# Patient Record
Sex: Female | Born: 1975 | Race: Black or African American | Hispanic: No | Marital: Single | Smoking: Never smoker
Health system: Southern US, Community
[De-identification: ages and names within clinical notes are randomized; demographics above are authoritative.]

## PROBLEM LIST (undated history)

## (undated) HISTORY — PX: BREAST REDUCTION SURGERY: SHX8

## (undated) HISTORY — PX: REDUCTION MAMMAPLASTY: SUR839

## (undated) HISTORY — PX: LAPAROSCOPIC GASTRIC BANDING: SHX1100

---

## 1998-11-27 ENCOUNTER — Encounter: Payer: Self-pay | Admitting: Emergency Medicine

## 1998-11-27 ENCOUNTER — Emergency Department (HOSPITAL_COMMUNITY): Admission: EM | Admit: 1998-11-27 | Discharge: 1998-11-27 | Payer: Self-pay | Admitting: Emergency Medicine

## 2002-02-05 ENCOUNTER — Encounter (INDEPENDENT_AMBULATORY_CARE_PROVIDER_SITE_OTHER): Payer: Self-pay | Admitting: Specialist

## 2002-02-05 ENCOUNTER — Ambulatory Visit (HOSPITAL_BASED_OUTPATIENT_CLINIC_OR_DEPARTMENT_OTHER): Admission: RE | Admit: 2002-02-05 | Discharge: 2002-02-05 | Payer: Self-pay | Admitting: Plastic Surgery

## 2003-05-23 ENCOUNTER — Emergency Department (HOSPITAL_COMMUNITY): Admission: EM | Admit: 2003-05-23 | Discharge: 2003-05-23 | Payer: Self-pay | Admitting: Emergency Medicine

## 2003-06-30 ENCOUNTER — Other Ambulatory Visit: Admission: RE | Admit: 2003-06-30 | Discharge: 2003-06-30 | Payer: Self-pay | Admitting: Obstetrics and Gynecology

## 2003-07-01 ENCOUNTER — Other Ambulatory Visit: Admission: RE | Admit: 2003-07-01 | Discharge: 2003-07-01 | Payer: Self-pay | Admitting: Obstetrics and Gynecology

## 2003-11-23 ENCOUNTER — Other Ambulatory Visit: Admission: RE | Admit: 2003-11-23 | Discharge: 2003-11-23 | Payer: Self-pay | Admitting: Obstetrics and Gynecology

## 2004-12-04 ENCOUNTER — Other Ambulatory Visit: Admission: RE | Admit: 2004-12-04 | Discharge: 2004-12-04 | Payer: Self-pay | Admitting: Obstetrics and Gynecology

## 2005-02-16 ENCOUNTER — Other Ambulatory Visit: Admission: RE | Admit: 2005-02-16 | Discharge: 2005-02-16 | Payer: Self-pay | Admitting: Obstetrics and Gynecology

## 2005-06-16 ENCOUNTER — Inpatient Hospital Stay (HOSPITAL_COMMUNITY): Admission: AD | Admit: 2005-06-16 | Discharge: 2005-06-16 | Payer: Self-pay | Admitting: Family Medicine

## 2006-05-27 ENCOUNTER — Encounter: Admission: RE | Admit: 2006-05-27 | Discharge: 2006-05-27 | Payer: Self-pay | Admitting: Obstetrics and Gynecology

## 2006-07-23 ENCOUNTER — Inpatient Hospital Stay (HOSPITAL_COMMUNITY): Admission: AD | Admit: 2006-07-23 | Discharge: 2006-07-26 | Payer: Self-pay | Admitting: Obstetrics and Gynecology

## 2007-08-13 ENCOUNTER — Encounter: Admission: RE | Admit: 2007-08-13 | Discharge: 2007-08-13 | Payer: Self-pay | Admitting: Family Medicine

## 2007-09-02 ENCOUNTER — Ambulatory Visit (HOSPITAL_COMMUNITY): Admission: RE | Admit: 2007-09-02 | Discharge: 2007-09-02 | Payer: Self-pay | Admitting: Surgery

## 2007-09-10 ENCOUNTER — Encounter: Admission: RE | Admit: 2007-09-10 | Discharge: 2007-11-24 | Payer: Self-pay | Admitting: Surgery

## 2007-09-12 ENCOUNTER — Ambulatory Visit (HOSPITAL_COMMUNITY): Admission: RE | Admit: 2007-09-12 | Discharge: 2007-09-12 | Payer: Self-pay | Admitting: Surgery

## 2007-12-09 ENCOUNTER — Ambulatory Visit (HOSPITAL_COMMUNITY): Admission: RE | Admit: 2007-12-09 | Discharge: 2007-12-10 | Payer: Self-pay | Admitting: Surgery

## 2007-12-09 HISTORY — PX: LAPAROSCOPIC GASTRIC BANDING: SHX1100

## 2007-12-31 ENCOUNTER — Encounter: Admission: RE | Admit: 2007-12-31 | Discharge: 2007-12-31 | Payer: Self-pay | Admitting: Surgery

## 2010-10-06 ENCOUNTER — Encounter
Admission: RE | Admit: 2010-10-06 | Discharge: 2010-10-06 | Payer: Self-pay | Source: Home / Self Care | Attending: Surgery | Admitting: Surgery

## 2011-03-06 NOTE — Op Note (Signed)
[name], [name]              ACCOUNT NO.:  [id number]   MEDICAL RECORD NO.:  [id number]          PATIENT TYPE:  OIB   LOCATION:  1527                         FACILITY:  [facility]   PHYSICIAN:  Thornton Park. Daphine Deutscher, MD  DATE OF BIRTH:  06/04/1976   DATE OF PROCEDURE:  12/09/2007  DATE OF DISCHARGE:                               OPERATIVE REPORT   PREOPERATIVE DIAGNOSIS:  Morbid obesity, BMI of 50 with arthritis and  history of gestational diabetes and sleep apnea.   SURGEON:  Thornton Park. Daphine Deutscher, MD   ASSISTANT:  Sandria Bales. Ezzard Standing, MD   ANESTHESIA:  General endotracheal.   PROCEDURE:  Placement of Allergan lap band APS system.   DESCRIPTION OF PROCEDURE:  Gina Curtis is a 35 year old African  American female taken to room 1 and given general anesthesia.  The  abdomen was prepped with Techni-Care and draped sterilely.  Access of  the abdomen was gained through the left upper quadrant using a zero  degree 10 mm Optiview approach without difficulty.  The abdomen was  insufflated and standard trocar placement was used with a 15 in the  right side upper port.  Eventually we dissected things nicely after  exposing the GE junction.  I first put down the APS testing balloon,  blew it up and pulled it back with 15 mL of air in it and it stopped at  the GE junction indicating there was unlikely any evidence of hiatal  hernia.  I had pulled up and gone over her studies and felt that there  was nothing that we could correct surgically.  I then passed the band  passer from the lower port on the right initially and it seemed like the  angle was a little bit too steep so I ended up putting the band in place  and bringing in from the upper port and it came in easily.  The band tip  was threaded into the band passer and brought around, snapped and  secured over the calibration tubing and then after it was completely  locked in place it was sutured in place with three sutures of Surgidac  using  free needles and tie knots.  The tubing was then brought out  through the lower port on the right, secured to the port and then  sutured to the fascia with four sutures of 2-0 Prolene.  The wounds were  all irrigated and then infiltrated with lidocaine and closed with 4-0  Vicryl with Dermabond.  The patient seemed to tolerate the procedure  well.  She was taken to the recovery room in satisfactory condition.      Thornton Park Daphine Deutscher, MD  Electronically Signed     MBM/MEDQ  D:  12/09/2007  T:  12/10/2007  Job:  [id number]   cc:   Jethro Bastos, M.D.  Fax: [phone]

## 2011-03-09 NOTE — Op Note (Signed)
[place]  Patient:    Gina Curtis, Gina Curtis Visit Number: [id number] MRN: [id number]          Service Type: DSU Location: [facility] Attending Physician:  Eloise Levels Dictated by:   Mary A. Contogiannis, M.D. Proc. Date: 02/05/02 Admit Date:  02/05/2002   CC:         Darcella Cheshire, M.D.   Operative Report  PREOPERATIVE DIAGNOSIS:  Bilateral macromastia.  POSTOPERATIVE DIAGNOSIS:  Bilateral macromastia.  PROCEDURE:  Bilateral reduction mammoplasty.  SURGEON:  Mary A. Contogiannis, M.D.  ASSISTANT:  Darcella Cheshire, M.D.  ANESTHESIA:  General endotracheal.  ANESTHESIOLOGIST:  Cliffton Asters. Ivin Booty, M.D.  ESTIMATED BLOOD LOSS:  250 cc.  FLUID REPLACEMENT:  2800 cc crystalloid.  URINE OUTPUT:  300 cc.  COMPLICATIONS:  None.  AMOUNT OF BREAST TISSUE REMOVED:  Right breast, 880 g; left breast, 908 g.  INDICATION FOR PROCEDURE:  The patient is a 35 year old African-American female who presents for bilateral reduction mammoplasties.  She has bilateral macromastia that is chronically symptomatic with neckaches, backaches, headaches, shoulder strap groove marks and pain.  JUSTIFICATION FOR OVERNIGHT STAY:  Progressive pain control along with ambulation and monitoring of the nipples and breast flaps.  DESCRIPTION OF PROCEDURE:  The patient was marked in the preop holding area in the pattern of Wise for the future reduction mammoplasties.  She was then taken back to the OR, placed on the table in supine position.  After adequate general endotracheal anesthesia was obtained, the patients chest was prepped with Betadine and alcohol and draped in a sterile fashion.  The skin and subcutaneous tissues at the base of both breasts were then injected with 1% lidocaine with epinephrine.  After adequate hemostasis and anesthesia had taken effect, the procedure was begun.  First the right breast reduction was performed.  The nipple was incised with  a 45 mm nipple marker.  The skin was then de-epithelialized around the nipple down to the inframammary crease in inferior pedicle pattern.  Next the medial, superior, and lateral skin flaps were elevated down to the chest wall.  The excess fat and glandular tissue were removed from the inferior pedicle.  The nipple was examined and found to be pink and viable.  The wound was irrigated with saline irrigation.  Meticulous hemostasis was obtained with the Bovie electrocautery.  The inferior pedicle was centralized using 3-0 Prolene suture.  A #10 JP flat, fully-fluted drain was then placed into the wound. Skin flaps brought together at the inverted T junction with a 2-0 Prolene suture.  The incisions were stapled for temporary closure.  Attention was then turned to the left breast.  The nipple was incised with the 45 mm nipple marker.  The skin was then de-epithelialized around the nipple down to the inframammary crease in inferior pedicle pattern.  Next the medial, superior, and lateral skin flaps were elevated down to the chest wall.  The excess fat and glandular tissue were removed from the inferior pedicle.  The nipple was then examined and found to be pink and viable.  The wound was irrigated with saline irrigation.  Meticulous hemostasis was obtained with the Bovie electrocautery.  The inferior pedicle was centralized using 3-0 Prolene suture.  A #10 JP flat, fully-fluted drain was placed into the wound.  The skin flaps were brought together at the inverted T junction with a 2-0 Prolene suture.  The incision was stapled for temporary closure.  The breasts were compared and found  to have good shape and symmetry.  The staples were then removed and the incisions closed with 3-0 Monocryl in the dermal layer, followed by 4-0 Monocryl running intracuticular stitch on the skin.  The patient was then placed in the upright position for future placement of the nipples.  The future nipple-areolar  complexes were marked with the 38 mm nipple marker.  She was then placed back into the supine position and recumbent.  First the future nipple-areolar complex on the right breast mound was incised.  This tissue was excised full-thickness.  The nipple was then examined and found to be pink and viable.  It was brought out into this aperture and sewn in place using 4-0 Monocryl interrupted dermal sutures followed by 5-0 Monocryl running intracuticular stitch on the skin.  Next, the future nipple-areolar complex on the left breast mound was incised.  This tissue was excised, and the nipple was examined and found to be pink and viable.  It was then brought out onto this aperture and sewn in place using 4-0 Monocryl interrupted dermal sutures, followed by 5-0 Monocryl running intracuticular stitch on the skin.  The JP drains were sewn in place using 3-0 nylon suture.  The incisions were then dressed with benzoin and Steri-Strips, followed by bacitracin ointment and Adaptic on the nipples.  The patient was then placed in a light postoperative support bra and 4 x 4s placed over the incisions.  There were no complications.  The patient tolerated the procedure well.  The final needle and sponge counts were reported to be correct at the end of the case.  The patient was then taken to the recovery room in stable condition.  She will remain overnight in the [facility] for progressive pain control along with ambulation and monitoring of the nipples and breast flaps. Discharge is planned for the morning. Dictated by:   Mary A. Contogiannis, M.D. Attending Physician:  Eloise Levels DD:  02/05/02 TD:  02/06/02 Job: [id number]

## 2011-06-29 ENCOUNTER — Encounter (INDEPENDENT_AMBULATORY_CARE_PROVIDER_SITE_OTHER): Payer: Self-pay

## 2011-06-29 ENCOUNTER — Ambulatory Visit (INDEPENDENT_AMBULATORY_CARE_PROVIDER_SITE_OTHER): Payer: Private Health Insurance - Indemnity | Admitting: Surgery

## 2011-06-29 ENCOUNTER — Encounter (INDEPENDENT_AMBULATORY_CARE_PROVIDER_SITE_OTHER): Payer: Private Health Insurance - Indemnity

## 2011-06-29 DIAGNOSIS — Z9884 Bariatric surgery status: Secondary | ICD-10-CM

## 2011-06-29 DIAGNOSIS — Z4651 Encounter for fitting and adjustment of gastric lap band: Secondary | ICD-10-CM

## 2011-06-29 NOTE — Progress Notes (Signed)
Has lost close to 100 lbs and is down under 200 lbs.  Felt that she needed a fill and I added 0.3 cc to her band.  Will see her back in 6 weeks. We talked about reducing the amount of salt intake in her food.

## 2011-07-13 LAB — DIFFERENTIAL
Basophils Absolute: 0
Basophils Relative: 0
Eosinophils Relative: 0
Lymphs Abs: 1.3
Monocytes Relative: 7
Neutro Abs: 10.9 — ABNORMAL HIGH

## 2011-07-13 LAB — BASIC METABOLIC PANEL
CO2: 28
Chloride: 103
GFR calc Af Amer: >60
GFR calc non Af Amer: >60

## 2011-07-13 LAB — CBC
RBC: 4.85
RDW: 15.4
WBC: 13.1 — ABNORMAL HIGH

## 2011-08-03 ENCOUNTER — Encounter (INDEPENDENT_AMBULATORY_CARE_PROVIDER_SITE_OTHER): Payer: Private Health Insurance - Indemnity | Admitting: Surgery

## 2012-10-23 ENCOUNTER — Encounter (INDEPENDENT_AMBULATORY_CARE_PROVIDER_SITE_OTHER): Payer: Private Health Insurance - Indemnity

## 2012-11-06 ENCOUNTER — Encounter (INDEPENDENT_AMBULATORY_CARE_PROVIDER_SITE_OTHER): Payer: Private Health Insurance - Indemnity

## 2013-02-12 ENCOUNTER — Ambulatory Visit (INDEPENDENT_AMBULATORY_CARE_PROVIDER_SITE_OTHER): Payer: Private Health Insurance - Indemnity | Admitting: Physician Assistant

## 2013-02-12 ENCOUNTER — Encounter (INDEPENDENT_AMBULATORY_CARE_PROVIDER_SITE_OTHER): Payer: Self-pay

## 2013-02-12 VITALS — BP 104/72 | HR 85 | Temp 98.0°F | Resp 16 | Ht 63.0 in | Wt 181.4 lb

## 2013-02-12 DIAGNOSIS — Z4651 Encounter for fitting and adjustment of gastric lap band: Secondary | ICD-10-CM

## 2013-02-12 NOTE — Patient Instructions (Signed)
Take clear liquids tonight. Thin protein shakes are ok to start tomorrow morning. Slowly advance your diet thereafter. Call us if you have persistent vomiting or regurgitation, night cough or reflux symptoms. Return as scheduled or sooner if you notice no changes in hunger/portion sizes.  

## 2013-02-12 NOTE — Progress Notes (Signed)
  HISTORY: Gina Curtis is a 37 y.o.female who received an AP-Standard lap-band in February 2009 by Dr. Daphine Deutscher. She comes in with 16 lbs weight loss since her last visit in September 2012. She is working hard at keeping this weight off. She has times when she can eat more than she feels is enough. She has no complaints of reflux or regurgitation. She thinks a fill will help get her weight down, ideally less than BMI 30.  VITAL SIGNS: Filed Vitals:   02/12/13 1608  BP: 104/72  Pulse: 85  Temp: 98 F (36.7 C)  Resp: 16    PHYSICAL EXAM: Physical exam reveals a very well-appearing 37 y.o.female in no apparent distress Neurologic: Awake, alert, oriented Psych: Bright affect, conversant Respiratory: Breathing even and unlabored. No stridor or wheezing Abdomen: Soft, nontender, nondistended to palpation. Incisions well-healed. No incisional hernias. Port easily palpated. Extremities: Atraumatic, good range of motion.  ASSESMENT: 37 y.o.  female  s/p AP-Standard lap-band.   PLAN: The patient's port was accessed with a 20G Huber needle without difficulty. Clear fluid was aspirated and 0.25 mL saline was added to the port. The patient was able to swallow water without difficulty following the procedure and was instructed to take clear liquids for the next 24-48 hours and advance slowly as tolerated.

## 2016-04-09 ENCOUNTER — Telehealth (HOSPITAL_COMMUNITY): Payer: Self-pay | Admitting: *Deleted

## 2016-04-09 NOTE — Telephone Encounter (Signed)
Telephoned patient at home # and left message to return call to [facility] 

## 2016-04-10 ENCOUNTER — Other Ambulatory Visit: Payer: Self-pay | Admitting: Obstetrics and Gynecology

## 2016-04-10 DIAGNOSIS — Z1231 Encounter for screening mammogram for malignant neoplasm of breast: Secondary | ICD-10-CM

## 2016-04-20 ENCOUNTER — Ambulatory Visit (HOSPITAL_COMMUNITY): Payer: Self-pay

## 2016-04-25 ENCOUNTER — Telehealth (HOSPITAL_COMMUNITY): Payer: Self-pay | Admitting: *Deleted

## 2016-04-25 NOTE — Telephone Encounter (Signed)
Telephoned patient at home # and left message to return call to [facility] 

## 2016-06-06 ENCOUNTER — Telehealth (HOSPITAL_COMMUNITY): Payer: Self-pay | Admitting: *Deleted

## 2016-06-06 NOTE — Telephone Encounter (Signed)
Telephoned patient at home number and left message concerning [facility] appointment for Thursday August 17 11:30.

## 2016-06-07 ENCOUNTER — Ambulatory Visit (HOSPITAL_COMMUNITY)
Admission: RE | Admit: 2016-06-07 | Discharge: 2016-06-07 | Disposition: A | Discharge status: Home / Self Care | Payer: Self-pay | Source: Ambulatory Visit | Attending: Obstetrics and Gynecology | Admitting: Obstetrics and Gynecology

## 2016-06-07 ENCOUNTER — Encounter (HOSPITAL_COMMUNITY): Payer: Self-pay | Admitting: *Deleted

## 2016-06-07 ENCOUNTER — Ambulatory Visit
Admission: RE | Admit: 2016-06-07 | Discharge: 2016-06-07 | Disposition: A | Discharge status: Home / Self Care | Payer: Self-pay | Source: Ambulatory Visit | Attending: Obstetrics and Gynecology | Admitting: Obstetrics and Gynecology

## 2016-06-07 VITALS — BP 112/74 | Temp 98.4°F | Ht 64.0 in | Wt 185.2 lb

## 2016-06-07 DIAGNOSIS — Z1231 Encounter for screening mammogram for malignant neoplasm of breast: Secondary | ICD-10-CM

## 2016-06-07 DIAGNOSIS — Z1239 Encounter for other screening for malignant neoplasm of breast: Secondary | ICD-10-CM

## 2016-06-07 NOTE — Progress Notes (Signed)
Complaints of mild bilateral breast pain before and during menstrual cycle.  Pap Smear:  Pap smear not completed today. Last Pap smear was in May 2017 at [facility] and normal per patient. Per patient has a history of an abnormal Pap smear over 5 years ago that cryotherapy was completed for follow up. Patient stated she has had at least three normal Pap smears since the cryotherapy. No Pap smear results are in EPIC.  Physical exam: Breasts Breasts symmetrical. Patient has scars under bilateral breasts due to history of breast reduction surgery. No nipple retraction bilateral breasts. No nipple discharge bilateral breasts. No lymphadenopathy. No lumps palpated bilateral breasts. No complaints of pain or tenderness on exam. [facility] Appointment scheduled for Thursday, June 07, 2016 at 1205.        Pelvic/Bimanual No Pap smear completed today since last Pap smear was in May 2017 per patient. Pap smear not indicated per [facility] guidelines.   Smoking History: Patient has never smoked.  Patient Navigation: Patient education provided. [facility].

## 2016-06-07 NOTE — Patient Instructions (Signed)
Explained breast self awareness to [employer]	[place].Patient did not need a Pap smear today due to last Pap smear was in May 2017 per patient. Let her know [facility] will cover Pap smears every 3 years unless has a history of abnormal Pap smears. [facility] Appointment scheduled for Thursday, June 07, 2016 at 1205.   Let patient know the Breast Center will follow up with her within the next couple weeks with results of mammogram by letter or phone. Gina SecondRenee Curtis verbalized understanding.  Curtis, Gina Maserhristine Poll, RN 1:10 PM

## 2016-06-11 ENCOUNTER — Encounter (HOSPITAL_COMMUNITY): Payer: Self-pay | Admitting: *Deleted

## 2016-06-12 ENCOUNTER — Other Ambulatory Visit: Payer: Self-pay | Admitting: Obstetrics and Gynecology

## 2016-06-12 DIAGNOSIS — R928 Other abnormal and inconclusive findings on diagnostic imaging of breast: Secondary | ICD-10-CM

## 2016-06-29 ENCOUNTER — Other Ambulatory Visit: Payer: No Typology Code available for payment source

## 2016-07-27 ENCOUNTER — Telehealth (HOSPITAL_COMMUNITY): Payer: Self-pay | Admitting: *Deleted

## 2016-07-27 NOTE — Telephone Encounter (Signed)
Patient [facility] 06[facility]. Additional imaging of the right breast is recommended for follow up. Patient was notified of results by the Breast Center and had a follow up appointment scheduled on 06/12/2016. Patient did not show for appointment. A letter was sent on 07/02/2016 from the Breast Center to remind patient to schedule follow up appointment. Patient was called by the Breast Center on 07/10/2016 and a voicemail was left for patient to call back. The Breast Center will send a certified letter to remind patient to schedule follow up appointment.

## 2016-08-08 ENCOUNTER — Telehealth (HOSPITAL_COMMUNITY): Payer: Self-pay | Admitting: *Deleted

## 2016-08-08 NOTE — Telephone Encounter (Signed)
Telephoned patient at home number and left message to return call to [facility] 

## 2016-08-22 ENCOUNTER — Telehealth (HOSPITAL_COMMUNITY): Payer: Self-pay | Admitting: *Deleted

## 2016-08-22 NOTE — Telephone Encounter (Signed)
Telephoned patient at home number and advised patient to call Breast Center for additional imaging. Patient states will call 11/2 for appointment.

## 2016-09-12 ENCOUNTER — Encounter (HOSPITAL_COMMUNITY): Payer: Self-pay | Admitting: *Deleted

## 2018-01-24 ENCOUNTER — Other Ambulatory Visit: Payer: Self-pay | Admitting: Family Medicine

## 2018-01-24 ENCOUNTER — Other Ambulatory Visit: Payer: Self-pay | Admitting: Obstetrics and Gynecology

## 2018-01-24 DIAGNOSIS — N6489 Other specified disorders of breast: Secondary | ICD-10-CM

## 2018-01-27 ENCOUNTER — Other Ambulatory Visit: Payer: Self-pay | Admitting: Physician Assistant

## 2018-01-27 DIAGNOSIS — N6489 Other specified disorders of breast: Secondary | ICD-10-CM

## 2018-01-31 ENCOUNTER — Ambulatory Visit: Payer: No Typology Code available for payment source

## 2018-01-31 ENCOUNTER — Ambulatory Visit
Admission: RE | Admit: 2018-01-31 | Discharge: 2018-01-31 | Disposition: A | Discharge status: Home / Self Care | Payer: BLUE CROSS/BLUE SHIELD | Source: Ambulatory Visit | Attending: Obstetrics and Gynecology | Admitting: Obstetrics and Gynecology

## 2018-01-31 DIAGNOSIS — N6489 Other specified disorders of breast: Secondary | ICD-10-CM

## 2018-02-21 ENCOUNTER — Encounter: Payer: Self-pay | Admitting: Obstetrics and Gynecology

## 2019-12-01 IMAGING — MG DIGITAL DIAGNOSTIC BILATERAL MAMMOGRAM WITH TOMO AND CAD
7 series · 8 of 19 positions shown · non-contrast
Comparison: Previous exam(s).

CLINICAL DATA: Patient was called back on screening exam [DATE] for
evaluation until today's exam.

EXAM:
DIGITAL DIAGNOSTIC BILATERAL MAMMOGRAM WITH CAD AND TOMO

[L MLO synth-2D]
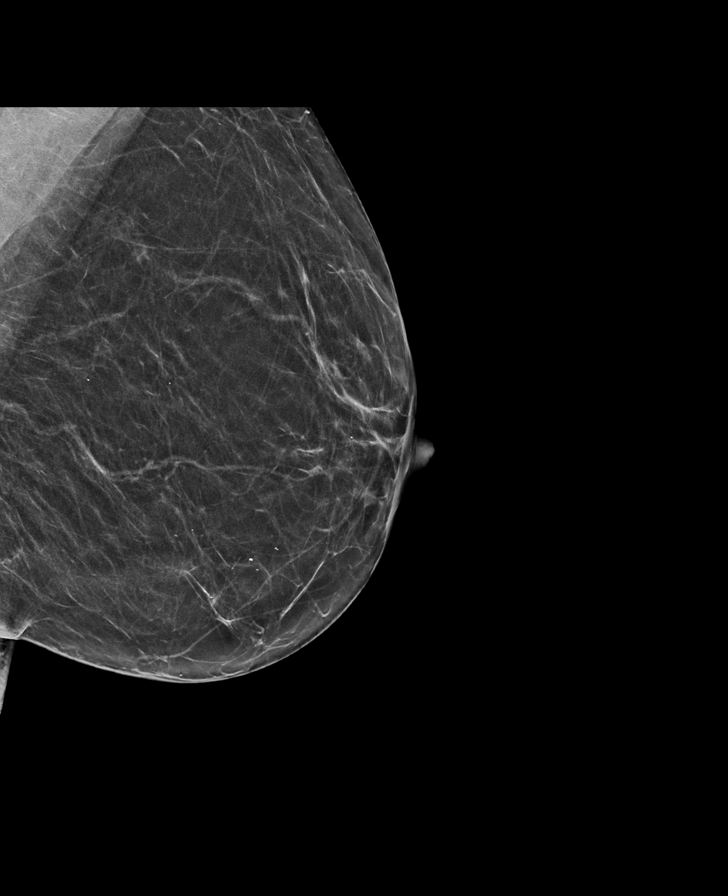

[L CC synth-2D]
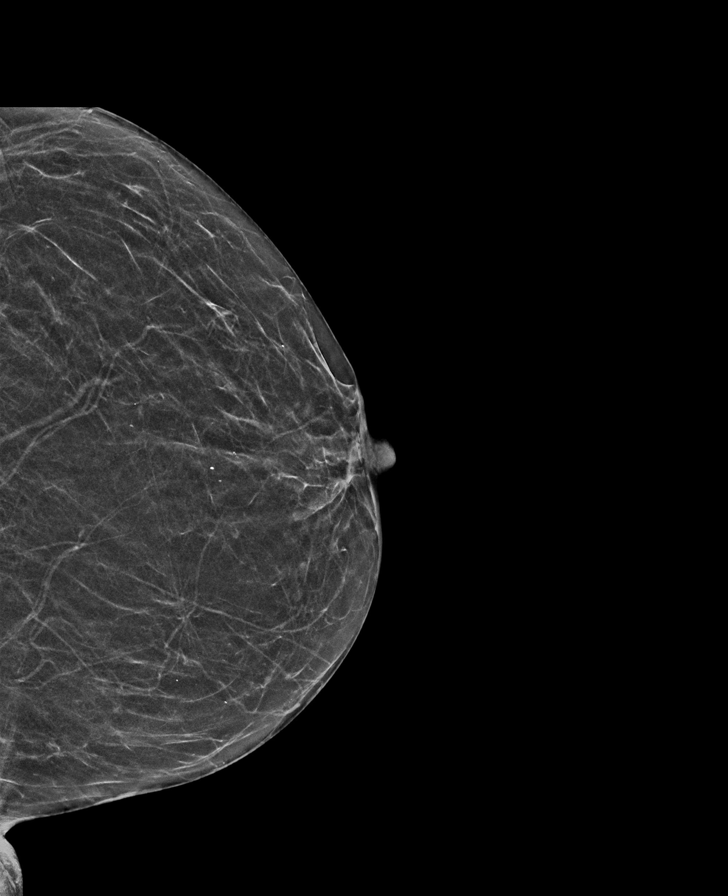

[R CC synth-2D]
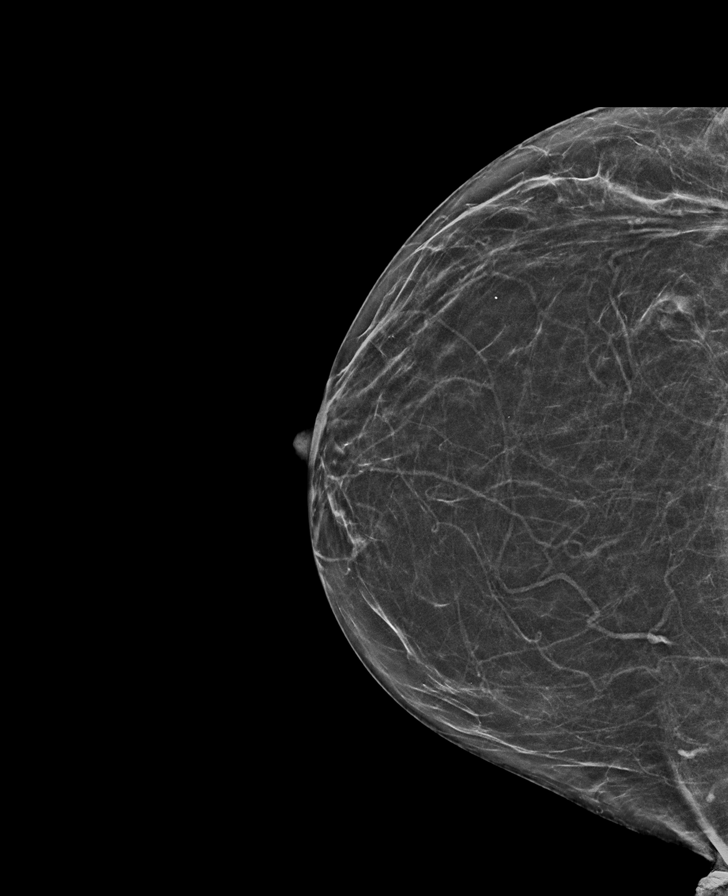

[R MLO synth-2D]
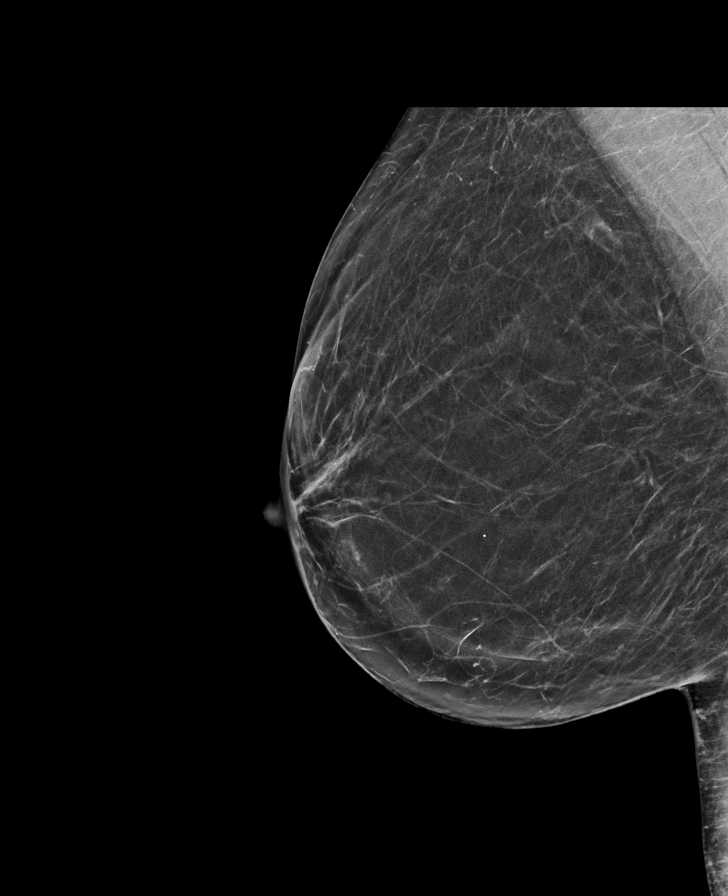

[R MLO tomo · 2 of 59 frames shown]
[frame 20/59]
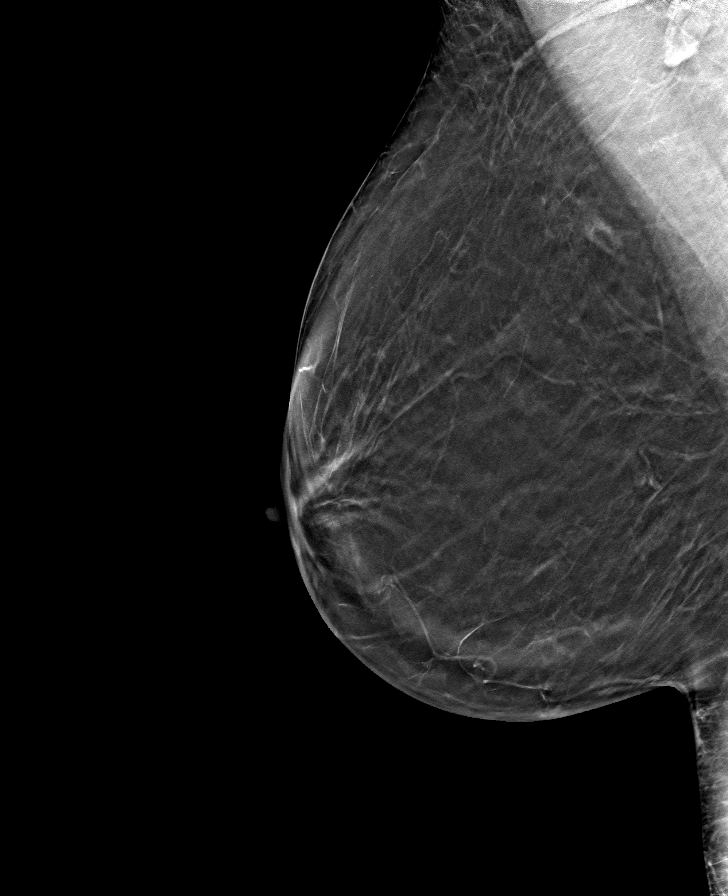
[frame 30/59]
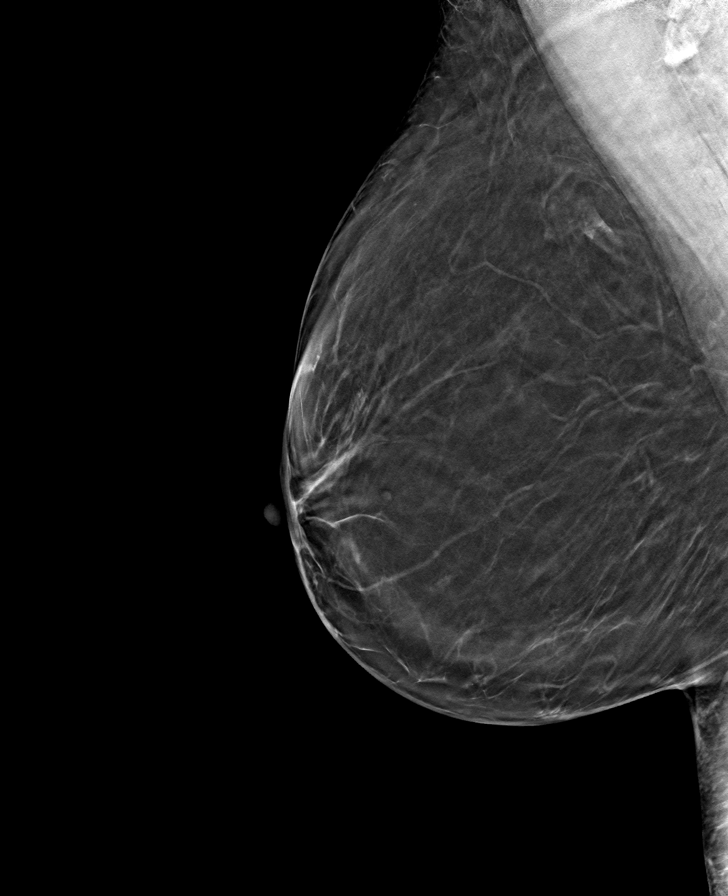

[R CC tomo · tomo slice 28/55.0]
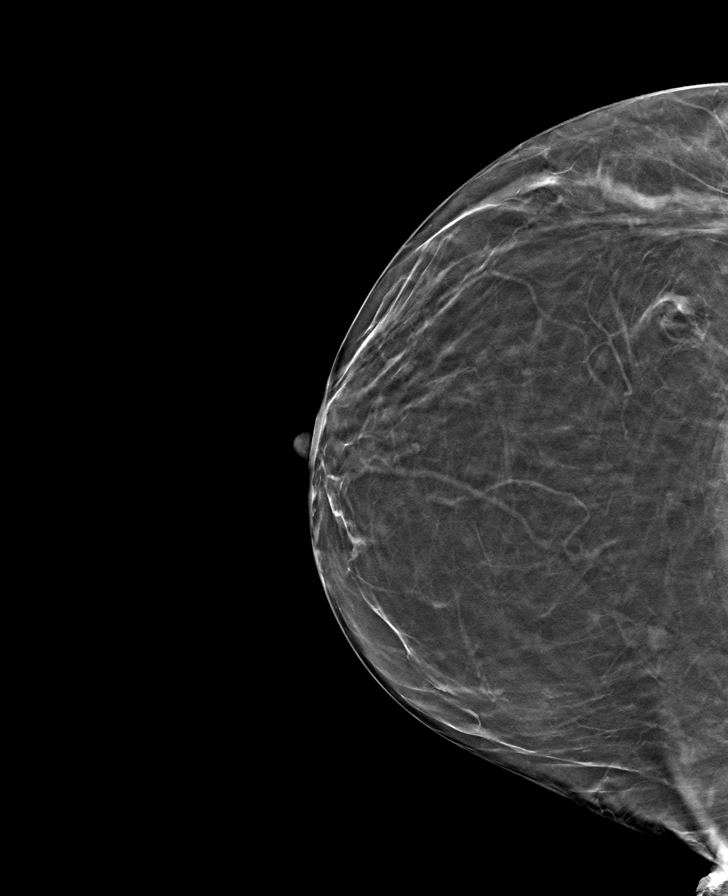

[L MLO tomo · tomo slice 30/59.0]
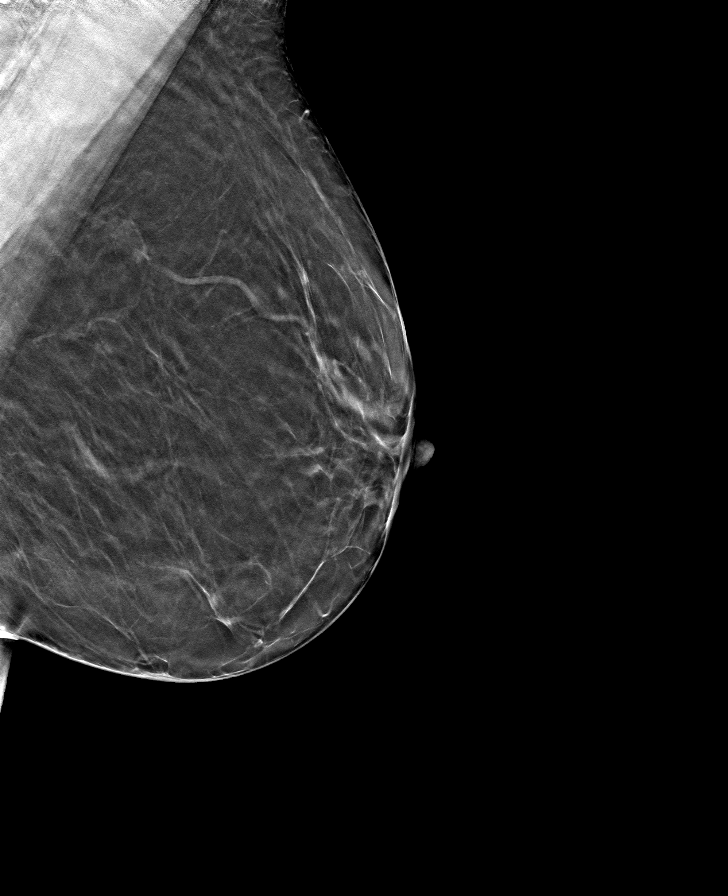

[8 of 19 positions shown; findings below may reference images not displayed]

ACR Breast Density Category b: There are scattered areas of
fibroglandular density.
FINDINGS: Persistent asymmetry within the upper-outer right breast, further
evaluated with tomosynthesis images. Findings represent postsurgical
change status post bilateral reduction mammoplasty. No suspicious
masses, calcifications or nonsurgical distortion identified within
either breast.

Mammographic images were processed with CAD.
IMPRESSION: Postreduction changes within both breasts.

No mammographic evidence for malignancy.

RECOMMENDATION:
Screening mammogram in one year.(Code:CI-[id number])

I have discussed the findings and recommendations with the patient.
Results were also provided in writing at the conclusion of the
visit. If applicable, a reminder letter will be sent to the patient
regarding the next appointment.

BI-RADS CATEGORY  2: Benign.

## 2020-01-23 ENCOUNTER — Ambulatory Visit: Payer: Medicaid Other | Attending: Family

## 2020-01-23 DIAGNOSIS — Z23 Encounter for immunization: Secondary | ICD-10-CM

## 2020-01-23 NOTE — Progress Notes (Signed)
   Covid-19 Vaccination Clinic  Name:  Gina Curtis    MRN: [id number] DOB: 03/06/1976  01/23/2020  Ms. Gina Curtis was observed post Covid-19 immunization for 15 minutes without incident. She was provided with Vaccine Information Sheet and instruction to access the V-Safe system.   Ms. Gina Curtis was instructed to call 911 with any severe reactions post vaccine: Marland Kitchen Difficulty breathing  . Swelling of face and throat  . A fast heartbeat  . A bad rash all over body  . Dizziness and weakness   Immunizations Administered    Name Date Dose VIS Date Route   JANSSEN COVID-19 VACCINE 01/23/2020  2:16 PM 0.5 mL 12/19/2019 Intramuscular   Manufacturer: [employer]   Lot: [id number]   NDC: 41287-867-67

## 2022-11-15 ENCOUNTER — Other Ambulatory Visit: Payer: Self-pay

## 2022-11-15 DIAGNOSIS — R7989 Other specified abnormal findings of blood chemistry: Secondary | ICD-10-CM

## 2022-11-15 DIAGNOSIS — E041 Nontoxic single thyroid nodule: Secondary | ICD-10-CM

## 2022-12-04 ENCOUNTER — Inpatient Hospital Stay: Admission: RE | Admit: 2022-12-04 | Payer: Medicaid Other | Source: Ambulatory Visit

## 2023-04-08 NOTE — Progress Notes (Deleted)
[facility] [phone]   [phone]  CONSULT NOTE  REFERRING PHYSICIAN:  Linus Galas NP  REASON FOR CONSULTATION:  IDA  HPI Gina Curtis is a 47 y.o. female with history for lap band surgery by Dr. Marland KitchenIn**,  ***is referred to clinic for iron deficiency anemia.  She saw her PCP in February 2024 in which the patient had microcytic anemia at that time and her iron studies showed iron deficiency.  There is recommended that she start on iron supplements.  If she had intolerance to iron supplements, she was advised to call back for consideration of prescription iron.  Patient then followed up for a 38-month follow-up visit on 03/20/2023.  She had repeat lab work a few days prior which continue to show similar microcytic anemia with a hemoglobin of 8.5, low MCV at 65, elevated platelet count at 503, and elevated RDW at 17.5.  Her iron studies showed significant iron deficiency with Krazati 4, elevated" 466, low iron at 11.  The patient reportedly had significant intolerance to iron supplement with constipation.  Therefore, she was referred to the clinic for consideration of IV iron.  To the patient's knowledge, she has had anemia for approximately ***years.  She has never required an iron infusion or blood transfusion before.  Labs iron deficiency at that time.  She her oldest labs available to me are from 2009.  She did not have any anemia at that time.  I then did not have any repeat labs until 2019 in which she had some microcytic anemia at that time with a hemoglobin of 10.4.  I do not have any other lab studies besides February 2024 which showed continued microcytic anemia.    She is not sure when she started to become anemic but it may have been since she was a teenager.  She denies ever needing a blood transfusion or an iron infusion before.    Sshe has a menstrual cycle every *** weeks lasting approximately *** days days.  ***heavy**8? Clots*** She  denies any spotting in between her cycles.     Overall regarding symptoms, she has some increased fatigue. She occasionally has lightheadedness and mild dyspnea on exertion.  She may ***have palpitations.  She denies gingival bleeding, epistaxis, hemoptysis, hematemesis, hematochezia, or melena.  Denies any changes in bowel habits.  Denies any fever, chills, or night sweats.  She denies any lymphadenopathy.  She may take *** ibuprofen *** for headaches.  She denies any particular dietary habits such as being a vegan or vegetarian, but she ****.  She still estimates she eats red meat approximately *** times per week.  She does not crave any ice chips.     She is not taking any blood thinners.    She denies any family history of sickle cell anemia or trait.     HPI  No past medical history on file.  Past Surgical History:  Procedure Laterality Date   BREAST REDUCTION SURGERY     LAPAROSCOPIC GASTRIC BANDING  12/09/07   LAPAROSCOPIC GASTRIC BANDING     REDUCTION MAMMAPLASTY Bilateral     Family History  Adopted: Yes    Social History Social History   Tobacco Use   Smoking status: Never   Smokeless tobacco: Never  Substance Use Topics   Alcohol use: Yes    Comment: social   Drug use: No    No Known Allergies  No current outpatient medications on file.   No current facility-administered  medications for this visit.    REVIEW OF SYSTEMS:   Review of Systems  Constitutional: Negative for appetite change, chills, fatigue, fever and unexpected weight change.  HENT:   Negative for mouth sores, nosebleeds, sore throat and trouble swallowing.   Eyes: Negative for eye problems and icterus.  Respiratory: Negative for cough, hemoptysis, shortness of breath and wheezing.   Cardiovascular: Negative for chest pain and leg swelling.  Gastrointestinal: Negative for abdominal pain, constipation, diarrhea, nausea and vomiting.  Genitourinary: Negative for bladder incontinence, difficulty  urinating, dysuria, frequency and hematuria.   Musculoskeletal: Negative for back pain, gait problem, neck pain and neck stiffness.  Skin: Negative for itching and rash.  Neurological: Negative for dizziness, extremity weakness, gait problem, headaches, light-headedness and seizures.  Hematological: Negative for adenopathy. Does not bruise/bleed easily.  Psychiatric/Behavioral: Negative for confusion, depression and sleep disturbance. The patient is not nervous/anxious.     PHYSICAL EXAMINATION:  There were no vitals taken for this visit.  ECOG PERFORMANCE STATUS: {CHL ONC ECOG [id number]  Physical Exam  Constitutional: Oriented to person, place, and time and well-developed, well-nourished, and in no distress. No distress.  HENT:  Head: Normocephalic and atraumatic.  Mouth/Throat: Oropharynx is clear and moist. No oropharyngeal exudate.  Eyes: Conjunctivae are normal. Right eye exhibits no discharge. Left eye exhibits no discharge. No scleral icterus.  Neck: Normal range of motion. Neck supple.  Cardiovascular: Normal rate, regular rhythm, normal heart sounds and intact distal pulses.   Pulmonary/Chest: Effort normal and breath sounds normal. No respiratory distress. No wheezes. No rales.  Abdominal: Soft. Bowel sounds are normal. Exhibits no distension and no mass. There is no tenderness.  Musculoskeletal: Normal range of motion. Exhibits no edema.  Lymphadenopathy:    No cervical adenopathy.  Neurological: Alert and oriented to person, place, and time. Exhibits normal muscle tone. Gait normal. Coordination normal.  Skin: Skin is warm and dry. No rash noted. Not diaphoretic. No erythema. No pallor.  Psychiatric: Mood, memory and judgment normal.  Vitals reviewed.  LABORATORY DATA: Lab Results  Component Value Date   WBC 13.1 (H) 12/10/2007   HGB 13.0 12/10/2007   HCT 38.4 12/10/2007   MCV 79.3 12/10/2007   PLT 362 12/10/2007      Chemistry      Component Value  Date/Time   NA 138 12/03/2007 1410   K 3.9 12/03/2007 1410   CL 103 12/03/2007 1410   CO2 28 12/03/2007 1410   BUN 9 12/03/2007 1410   CREATININE 0.85 12/03/2007 1410      Component Value Date/Time   CALCIUM 9.5 12/03/2007 1410       RADIOGRAPHIC STUDIES: No results found.  ASSESSMENT: This is a very pleasant 47 year old ***female referred to the clinic for iron deficiency anemia. She has a history of lap band in ***    The patient had several lab studies performed today including CBC, CMP, iron studies, ferritin, B12, folate, stool cards, SPEP, and hemoglobin electrophoresis.   The patient's labs from today demonstrate significant microcytic anemia with a hemoglobin of ***, very low MCV at *** and elevated RDW at ***.  Her CMP is unremarkable.  Her other lab studies are pending at this time.   We anticipate the patient's iron will be low.  She likely will benefit from iron infusions with Venofer 400 mg weekly x 3. We will see if this can be given on ***    ***Prescription iron***. Has significant intolerance to OTC iron with severe constipation. She also  was instructed to take an over-the-counter iron supplement 1 tablet p.o. daily with vitamin C.  She is also given a handout on iron rich food on her AVS and she was strongly encouraged to increase her dietary intake of iron rich food.   Will see her back for follow-up visit in 2 months for evaluation repeat blood work.  ***I will arrange for stool cards to rule out GI blood loss. If positive, then would recommend referral to GI.   The patient voices understanding of current disease status and treatment options and is in agreement with the current care plan.  All questions were answered. The patient knows to call the clinic with any problems, questions or concerns. We can certainly see the patient much sooner if necessary.  Thank you so much for allowing me to participate in the care of Gina Curtis. I will continue to follow up  the patient with you and assist in her care.  I spent {CHL ONC TIME VISIT - WGNFA:[id number]} counseling the patient face to face. The total time spent in the appointment was {CHL ONC TIME VISIT - ONGEX:[id number]}.  Disclaimer: This note was dictated with voice recognition software. Similar sounding words can inadvertently be transcribed and may not be corrected upon review.   Tedi Hughson L Rose Hegner April 08, 2023, 8:20 PM

## 2023-04-09 ENCOUNTER — Other Ambulatory Visit: Payer: Self-pay | Admitting: Medical Oncology

## 2023-04-09 DIAGNOSIS — D649 Anemia, unspecified: Secondary | ICD-10-CM

## 2023-04-10 ENCOUNTER — Other Ambulatory Visit: Payer: Self-pay | Admitting: Physician Assistant

## 2023-04-10 ENCOUNTER — Inpatient Hospital Stay: Payer: Self-pay

## 2023-04-10 ENCOUNTER — Inpatient Hospital Stay: Payer: Self-pay | Attending: Physician Assistant | Admitting: Physician Assistant

## 2023-04-10 DIAGNOSIS — D649 Anemia, unspecified: Secondary | ICD-10-CM

## 2023-05-16 NOTE — Progress Notes (Deleted)
[facility] [phone]   Fax:(336) [phone]  CONSULT NOTE  REFERRING PHYSICIAN: Linus Galas NP  REASON FOR CONSULTATION:  IDA  HPI Gina Curtis is a 47 y.o. female.  with history for lap band surgery by Dr. Marland KitchenIn**,  ***is referred to clinic for iron deficiency anemia.  She saw her PCP in February 2024 in which the patient had microcytic anemia at that time and her iron studies showed iron deficiency.  There is recommended that she start on iron supplements.  If she had intolerance to iron supplements, she was advised to call back for consideration of prescription iron.  Patient then followed up for a 31-month follow-up visit on 03/20/2023.  She had repeat lab work a few days prior which continue to show similar microcytic anemia with a hemoglobin of 8.5, low MCV at 65, elevated platelet count at 503, and elevated RDW at 17.5.  Her iron studies showed significant iron deficiency with Krazati 4, elevated" 466, low iron at 11.  The patient reportedly had significant intolerance to iron supplement with constipation.  Therefore, she was referred to the clinic for consideration of IV iron.  To the patient's knowledge, she has had anemia for approximately ***years.  She has never required an iron infusion or blood transfusion before.  Labs iron deficiency at that time.  She her oldest labs available to me are from 2009.  She did not have any anemia at that time.  I then did not have any repeat labs until 2019 in which she had some microcytic anemia at that time with a hemoglobin of 10.4.  I do not have any other lab studies besides February 2024 which showed continued microcytic anemia.    She is not sure when she started to become anemic but it may have been since she was a teenager.  She denies ever needing a blood transfusion or an iron infusion before.    Sshe has a menstrual cycle every *** weeks lasting approximately *** days days.  ***heavy**8? Clots*** She  denies any spotting in between her cycles.     Overall regarding symptoms, she has some increased fatigue. She occasionally has lightheadedness and mild dyspnea on exertion.  She may ***have palpitations.  She denies gingival bleeding, epistaxis, hemoptysis, hematemesis, hematochezia, or melena.  Denies any changes in bowel habits.  Denies any fever, chills, or night sweats.  She denies any lymphadenopathy.  She may take *** ibuprofen *** for headaches.  She denies any particular dietary habits such as being a vegan or vegetarian, but she ****.  She still estimates she eats red meat approximately *** times per week.  She does not crave any ice chips.     She is not taking any blood thinners.    She denies any family history of sickle cell anemia or trait.   HPI  No past medical history on file.  Past Surgical History:  Procedure Laterality Date   BREAST REDUCTION SURGERY     LAPAROSCOPIC GASTRIC BANDING  12/09/07   LAPAROSCOPIC GASTRIC BANDING     REDUCTION MAMMAPLASTY Bilateral     Family History  Adopted: Yes    Social History Social History   Tobacco Use   Smoking status: Never   Smokeless tobacco: Never  Substance Use Topics   Alcohol use: Yes    Comment: social   Drug use: No    No Known Allergies  No current outpatient medications on file.   No current facility-administered medications for  this visit.    REVIEW OF SYSTEMS:   Review of Systems  Constitutional: Negative for appetite change, chills, fatigue, fever and unexpected weight change.  HENT:   Negative for mouth sores, nosebleeds, sore throat and trouble swallowing.   Eyes: Negative for eye problems and icterus.  Respiratory: Negative for cough, hemoptysis, shortness of breath and wheezing.   Cardiovascular: Negative for chest pain and leg swelling.  Gastrointestinal: Negative for abdominal pain, constipation, diarrhea, nausea and vomiting.  Genitourinary: Negative for bladder incontinence, difficulty  urinating, dysuria, frequency and hematuria.   Musculoskeletal: Negative for back pain, gait problem, neck pain and neck stiffness.  Skin: Negative for itching and rash.  Neurological: Negative for dizziness, extremity weakness, gait problem, headaches, light-headedness and seizures.  Hematological: Negative for adenopathy. Does not bruise/bleed easily.  Psychiatric/Behavioral: Negative for confusion, depression and sleep disturbance. The patient is not nervous/anxious.     PHYSICAL EXAMINATION:  There were no vitals taken for this visit.  ECOG PERFORMANCE STATUS: {CHL ONC ECOG [id number]  Physical Exam  Constitutional: Oriented to person, place, and time and well-developed, well-nourished, and in no distress. No distress.  HENT:  Head: Normocephalic and atraumatic.  Mouth/Throat: Oropharynx is clear and moist. No oropharyngeal exudate.  Eyes: Conjunctivae are normal. Right eye exhibits no discharge. Left eye exhibits no discharge. No scleral icterus.  Neck: Normal range of motion. Neck supple.  Cardiovascular: Normal rate, regular rhythm, normal heart sounds and intact distal pulses.   Pulmonary/Chest: Effort normal and breath sounds normal. No respiratory distress. No wheezes. No rales.  Abdominal: Soft. Bowel sounds are normal. Exhibits no distension and no mass. There is no tenderness.  Musculoskeletal: Normal range of motion. Exhibits no edema.  Lymphadenopathy:    No cervical adenopathy.  Neurological: Alert and oriented to person, place, and time. Exhibits normal muscle tone. Gait normal. Coordination normal.  Skin: Skin is warm and dry. No rash noted. Not diaphoretic. No erythema. No pallor.  Psychiatric: Mood, memory and judgment normal.  Vitals reviewed.  LABORATORY DATA: Lab Results  Component Value Date   WBC 13.1 (H) 12/10/2007   HGB 13.0 12/10/2007   HCT 38.4 12/10/2007   MCV 79.3 12/10/2007   PLT 362 12/10/2007      Chemistry      Component Value  Date/Time   NA 138 12/03/2007 1410   K 3.9 12/03/2007 1410   CL 103 12/03/2007 1410   CO2 28 12/03/2007 1410   BUN 9 12/03/2007 1410   CREATININE 0.85 12/03/2007 1410      Component Value Date/Time   CALCIUM 9.5 12/03/2007 1410       RADIOGRAPHIC STUDIES: No results found.  ASSESSMENT: This is a very pleasant 47 year old ***female referred to the clinic for iron deficiency anemia. She has a history of lap band in ***    The patient had several lab studies performed today including CBC, CMP, iron studies, ferritin, B12, folate, stool cards, SPEP, and hemoglobin electrophoresis.   The patient's labs from today demonstrate significant microcytic anemia with a hemoglobin of ***, very low MCV at *** and elevated RDW at ***.  Her CMP is unremarkable.  Her other lab studies are pending at this time.   We anticipate the patient's iron will be low.  She likely will benefit from iron infusions with Venofer 400 mg weekly x 3. We will see if this can be given on ***    ***Prescription iron***. Has significant intolerance to OTC iron with severe constipation. She also was instructed  to take an over-the-counter iron supplement 1 tablet p.o. daily with vitamin C.  She is also given a handout on iron rich food on her AVS and she was strongly encouraged to increase her dietary intake of iron rich food.   Will see her back for follow-up visit in 2 months for evaluation repeat blood work.  ***I will arrange for stool cards to rule out GI blood loss. If positive, then would recommend referral to GI.   The patient voices understanding of current disease status and treatment options and is in agreement with the current care plan.  All questions were answered. The patient knows to call the clinic with any problems, questions or concerns. We can certainly see the patient much sooner if necessary.  Thank you so much for allowing me to participate in the care of Gina Curtis. I will continue to follow  up the patient with you and assist in her care.  I spent {CHL ONC TIME VISIT - YTKZS:[id number]} counseling the patient face to face. The total time spent in the appointment was {CHL ONC TIME VISIT - DUKGU:[id number]}.  Disclaimer: This note was dictated with voice recognition software. Similar sounding words can inadvertently be transcribed and may not be corrected upon review.    L  May 16, 2023, 7:02 PM

## 2023-05-20 ENCOUNTER — Inpatient Hospital Stay: Payer: Self-pay | Attending: Physician Assistant

## 2023-05-20 ENCOUNTER — Inpatient Hospital Stay: Payer: Self-pay | Admitting: Physician Assistant

## 2023-07-31 NOTE — Progress Notes (Deleted)
[facility] [phone]   Fax:([phone]  CONSULT NOTE  REFERRING PHYSICIAN: Linus Galas NP  REASON FOR CONSULTATION:  Iron Deficiency anemia   HPI Gina Curtis is a 47 y.o. female with history for lap band surgery by Dr. Marland KitchenIn**,  ***is referred to clinic for iron deficiency anemia.  She saw her PCP in February 2024 in which the patient had microcytic anemia at that time and her iron studies showed iron deficiency.  There is recommended that she start on iron supplements.  If she had intolerance to iron supplements, she was advised to call back for consideration of prescription iron.  Patient then followed up for a 8-month follow-up visit on 03/20/2023.  She had repeat lab work a few days prior which continue to show similar microcytic anemia with a hemoglobin of 8.5, low MCV at 65, elevated platelet count at 503, and elevated RDW at 17.5.  Her iron studies showed significant iron deficiency with Krazati 4, elevated" 466, low iron at 11.  The patient reportedly had significant intolerance to iron supplement with constipation.  Therefore, she was referred to the clinic for consideration of IV iron.  To the patient's knowledge, she has had anemia for approximately ***years.  She has never required an iron infusion or blood transfusion before.  Labs iron deficiency at that time.  She her oldest labs available to me are from 2009.  She did not have any anemia at that time.  I then did not have any repeat labs until 2019 in which she had some microcytic anemia at that time with a hemoglobin of 10.4.  I do not have any other lab studies besides February 2024 which showed continued microcytic anemia.    She is not sure when she started to become anemic but it may have been since she was a teenager.  She denies ever needing a blood transfusion or an iron infusion before.    Sshe has a menstrual cycle every *** weeks lasting approximately *** days days.   ***heavy**8? Clots*** She denies any spotting in between her cycles.     Overall regarding symptoms, she has some increased fatigue. She occasionally has lightheadedness and mild dyspnea on exertion.  She may ***have palpitations.  She denies gingival bleeding, epistaxis, hemoptysis, hematemesis, hematochezia, or melena.  Denies any changes in bowel habits.  Denies any fever, chills, or night sweats.  She denies any lymphadenopathy.  She may take *** ibuprofen *** for headaches.  She denies any particular dietary habits such as being a vegan or vegetarian, but she ****.  She still estimates she eats red meat approximately *** times per week.  She does not crave any ice chips.     She is not taking any blood thinners.    She denies any family history of sickle cell anemia or trait.   HPI  No past medical history on file.  Past Surgical History:  Procedure Laterality Date   BREAST REDUCTION SURGERY     LAPAROSCOPIC GASTRIC BANDING  12/09/07   LAPAROSCOPIC GASTRIC BANDING     REDUCTION MAMMAPLASTY Bilateral     Family History  Adopted: Yes    Social History Social History   Tobacco Use   Smoking status: Never   Smokeless tobacco: Never  Substance Use Topics   Alcohol use: Yes    Comment: social   Drug use: No    No Known Allergies  No current outpatient medications on file.   No current facility-administered  medications for this visit.    REVIEW OF SYSTEMS:   Review of Systems  Constitutional: Negative for appetite change, chills, fatigue, fever and unexpected weight change.  HENT:   Negative for mouth sores, nosebleeds, sore throat and trouble swallowing.   Eyes: Negative for eye problems and icterus.  Respiratory: Negative for cough, hemoptysis, shortness of breath and wheezing.   Cardiovascular: Negative for chest pain and leg swelling.  Gastrointestinal: Negative for abdominal pain, constipation, diarrhea, nausea and vomiting.  Genitourinary: Negative for bladder  incontinence, difficulty urinating, dysuria, frequency and hematuria.   Musculoskeletal: Negative for back pain, gait problem, neck pain and neck stiffness.  Skin: Negative for itching and rash.  Neurological: Negative for dizziness, extremity weakness, gait problem, headaches, light-headedness and seizures.  Hematological: Negative for adenopathy. Does not bruise/bleed easily.  Psychiatric/Behavioral: Negative for confusion, depression and sleep disturbance. The patient is not nervous/anxious.     PHYSICAL EXAMINATION:  There were no vitals taken for this visit.  ECOG PERFORMANCE STATUS: {CHL ONC ECOG [id number]  Physical Exam  Constitutional: Oriented to person, place, and time and well-developed, well-nourished, and in no distress. No distress.  HENT:  Head: Normocephalic and atraumatic.  Mouth/Throat: Oropharynx is clear and moist. No oropharyngeal exudate.  Eyes: Conjunctivae are normal. Right eye exhibits no discharge. Left eye exhibits no discharge. No scleral icterus.  Neck: Normal range of motion. Neck supple.  Cardiovascular: Normal rate, regular rhythm, normal heart sounds and intact distal pulses.   Pulmonary/Chest: Effort normal and breath sounds normal. No respiratory distress. No wheezes. No rales.  Abdominal: Soft. Bowel sounds are normal. Exhibits no distension and no mass. There is no tenderness.  Musculoskeletal: Normal range of motion. Exhibits no edema.  Lymphadenopathy:    No cervical adenopathy.  Neurological: Alert and oriented to person, place, and time. Exhibits normal muscle tone. Gait normal. Coordination normal.  Skin: Skin is warm and dry. No rash noted. Not diaphoretic. No erythema. No pallor.  Psychiatric: Mood, memory and judgment normal.  Vitals reviewed.  LABORATORY DATA: Lab Results  Component Value Date   WBC 13.1 (H) 12/10/2007   HGB 13.0 12/10/2007   HCT 38.4 12/10/2007   MCV 79.3 12/10/2007   PLT 362 12/10/2007      Chemistry       Component Value Date/Time   NA 138 12/03/2007 1410   K 3.9 12/03/2007 1410   CL 103 12/03/2007 1410   CO2 28 12/03/2007 1410   BUN 9 12/03/2007 1410   CREATININE 0.85 12/03/2007 1410      Component Value Date/Time   CALCIUM 9.5 12/03/2007 1410       RADIOGRAPHIC STUDIES: No results found.  ASSESSMENT: This is a very pleasant 47 year old ***female referred to the clinic for iron deficiency anemia. She has a history of lap band in ***    The patient had several lab studies performed today including CBC, CMP, iron studies, ferritin, B12, folate, stool cards, SPEP, and hemoglobin electrophoresis.   The patient's labs from today demonstrate significant microcytic anemia with a hemoglobin of ***, very low MCV at *** and elevated RDW at ***.  Her CMP is unremarkable.  Her other lab studies are pending at this time.   We anticipate the patient's iron will be low.  She likely will benefit from iron infusions with Venofer 400 mg weekly x 3. We will see if this can be given on ***    ***Prescription iron***. Has significant intolerance to OTC iron with severe constipation. She also  was instructed to take an over-the-counter iron supplement 1 tablet p.o. daily with vitamin C.  She is also given a handout on iron rich food on her AVS and she was strongly encouraged to increase her dietary intake of iron rich food.   Will see her back for follow-up visit in 2 months for evaluation repeat blood work.  ***I will arrange for stool cards to rule out GI blood loss. If positive, then would recommend referral to GI.  The patient voices understanding of current disease status and treatment options and is in agreement with the current care plan.  All questions were answered. The patient knows to call the clinic with any problems, questions or concerns. We can certainly see the patient much sooner if necessary.  Thank you so much for allowing me to participate in the care of Gina Curtis. I will  continue to follow up the patient with you and assist in her care.  I spent {CHL ONC TIME VISIT - WUJWJ:[id number]} counseling the patient face to face. The total time spent in the appointment was {CHL ONC TIME VISIT - OZHYQ:[id number]}.  Disclaimer: This note was dictated with voice recognition software. Similar sounding words can inadvertently be transcribed and may not be corrected upon review.   Virjean Boman L Smera Guyette July 31, 2023, 1:52 PM

## 2023-08-06 ENCOUNTER — Inpatient Hospital Stay: Payer: Self-pay

## 2023-08-06 ENCOUNTER — Inpatient Hospital Stay: Payer: Self-pay | Attending: Physician Assistant | Admitting: Physician Assistant

## 2023-08-12 ENCOUNTER — Telehealth: Payer: Self-pay | Admitting: Physician Assistant

## 2023-08-12 NOTE — Telephone Encounter (Signed)
Left message for Gina Curtis at the referring office that the patient no longer can be scheduled here at the [facility] due the amount of no shows. Left my direct line for additional questions or concerns.
